# Patient Record
Sex: Male | Born: 1975 | Race: Black or African American | Hispanic: No | Marital: Married | Smoking: Never smoker
Health system: Southern US, Community
[De-identification: ages and names within clinical notes are randomized; demographics above are authoritative.]

## PROBLEM LIST (undated history)

## (undated) HISTORY — PX: ABDOMINAL SURGERY: SHX537

---

## 2017-06-15 ENCOUNTER — Emergency Department
Admission: EM | Admit: 2017-06-15 | Discharge: 2017-06-15 | Disposition: A | Discharge status: Home / Self Care | Payer: Self-pay | Attending: Emergency Medicine | Admitting: Emergency Medicine

## 2017-06-15 DIAGNOSIS — K0889 Other specified disorders of teeth and supporting structures: Secondary | ICD-10-CM | POA: Insufficient documentation

## 2017-06-15 DIAGNOSIS — K047 Periapical abscess without sinus: Secondary | ICD-10-CM | POA: Insufficient documentation

## 2017-06-15 MED ORDER — SULFAMETHOXAZOLE-TRIMETHOPRIM 800-160 MG PO TABS
ORAL_TABLET | ORAL | Status: AC
  Filled 2017-06-15: qty 1

## 2017-06-15 MED ORDER — SULFAMETHOXAZOLE-TRIMETHOPRIM 800-160 MG PO TABS
1.0000 | ORAL_TABLET | Freq: Once | ORAL | Status: AC
  Administered 2017-06-15: 1 via ORAL

## 2017-06-15 MED ORDER — KETOROLAC TROMETHAMINE 10 MG PO TABS: 10.0000 mg | ORAL_TABLET | Freq: Four times a day (QID) | ORAL | 0 refills | Status: AC | PRN

## 2017-06-15 MED ORDER — KETOROLAC TROMETHAMINE 30 MG/ML IJ SOLN
30.0000 mg | Freq: Once | INTRAMUSCULAR | Status: AC
  Administered 2017-06-15: 30 mg via INTRAMUSCULAR
  Filled 2017-06-15: qty 1

## 2017-06-15 MED ORDER — SULFAMETHOXAZOLE-TRIMETHOPRIM 800-160 MG PO TABS: 1.0000 | ORAL_TABLET | Freq: Two times a day (BID) | ORAL | 0 refills | Status: DC

## 2017-06-15 NOTE — ED Provider Notes (Signed)
[facility] Emergency Department Provider Note   ____________________________________________   I have reviewed the triage vital signs and the nursing notes.   HISTORY  Chief Complaint Dental Pain    HPI Micheal Perry is a 41 y.o. male presents to the emergency department with left upper jaw dental pain that began 2 days ago. Patient reports the upper molars, molar 1 and 2 have been decayed for quite some time however this is the first time he has experienced pain, swelling and likely infection. Patient denies any recent dental or facial trauma. Patient reports he does not see a dentist regularly. Patient denies fever, chills, headache, vision changes, chest pain, chest tightness, shortness of breath, abdominal pain, nausea and vomiting.  No past medical history on file.  There are no active problems to display for this patient.   No past surgical history on file.  Prior to Admission medications   Medication Sig Start Date End Date Taking? Authorizing Provider  ketorolac (TORADOL) 10 MG tablet Take 1 tablet (10 mg total) by mouth every 6 (six) hours as needed. 06/15/17 06/20/17  Merry Pond M, PA-C  sulfamethoxazole-trimethoprim (BACTRIM DS,SEPTRA DS) 800-160 MG tablet Take 1 tablet by mouth 2 (two) times daily. 06/15/17   Krosby Ritchie M, PA-C    Allergies Patient has no known allergies.  No family history on file.  Social History Social History  Substance Use Topics  . Smoking status: Not on file  . Smokeless tobacco: Not on file  . Alcohol use Not on file    Review of Systems Constitutional: Positive for low-grade fever. Eyes: No visual changes. ENT:  Negative for sore throat and for difficulty swallowing. Positive for left upper jaw and dental pain. Cardiovascular: Denies chest pain. Respiratory: Denies cough. Denies shortness of breath. Skin: Negative for rash. Neurological: Negative for headaches.   ____________________________________________   PHYSICAL EXAM:  VITAL SIGNS: ED Triage Vitals  Enc Vitals Group     BP 06/15/17 2047 131/85     Pulse Rate 06/15/17 2047 62     Resp 06/15/17 2047 18     Temp 06/15/17 2047 99.1 F (37.3 C)     Temp Source 06/15/17 2047 Oral     SpO2 06/15/17 2047 97 %     Weight 06/15/17 2047 206 lb (93.4 kg)     Height 06/15/17 2047 6\' 1"  (1.854 m)     Head Circumference --      Peak Flow --      Pain Score 06/15/17 2046 9     Pain Loc --      Pain Edu? --      Excl. in GC? --     Constitutional: Alert and oriented. Well appearing and in no acute distress.  Head: Normocephalic and atraumatic. Eyes: Conjunctivae are normal. PERRL. Normal extraocular movements.  Nose: No congestion/rhinorrhea Mouth/Throat: Mucous membranes are moist. Oropharynx erythematous or edematous.. Tonsils symmetrical bilaterally noninflamed. Uvula midline. Left upper gum line erythematous with mild edema. Molar 1 and molar 2 significant dental caries. Buccal mucosa erythematous without edema.  Neck: Supple. Hematological/Lymphatic/Immunological: No lymphadenopathy. Cardiovascular: Normal rate, regular rhythm. Normal distal pulses. Respiratory: Normal respiratory effort. No wheezes/rales/rhonchi. Lungs CTAB Musculoskeletal: Nontender with normal range of motion in all extremities. Neurologic: Normal speech and language.  Skin:  Skin is warm, dry and intact. No rash noted. Psychiatric: Mood and affect are normal.  ____________________________________________   LABS (all labs ordered are listed, but only abnormal results are displayed)  Labs  Reviewed - No data to display ____________________________________________  EKG None ____________________________________________  RADIOLOGY None ____________________________________________   PROCEDURES  Procedure(s) performed: No    Critical Care performed:  no ____________________________________________   INITIAL IMPRESSION / ASSESSMENT AND PLAN / ED COURSE  Pertinent labs & imaging results that were available during my care of the patient were reviewed by me and considered in my medical decision making (see chart for details).  Patient presents to emergency department with left upper jaw dental pain.Marland Kitchen. History and physical exam findings are consistent with left upper jaw dental infection. Patient  given Toradol 30 mg IM for pain management an initial dose of Bactrim for antibiotic coverage during the course of care in the emergency department. Patient will be prescribed Bactrim for coverage and a 5 day course of Toradol for pain and inflammation.. Patient advised to follow up with a dental clinic for continued care or return to the emergency department if symptoms return or worsen. Patient informed of clinical course, understand medical decision-making process, and agree with plan. ____________________________________________   FINAL CLINICAL IMPRESSION(S) / ED DIAGNOSES  Final diagnoses:  Pain, dental  Dental infection       NEW MEDICATIONS STARTED DURING THIS VISIT:  New Prescriptions   KETOROLAC (TORADOL) 10 MG TABLET    Take 1 tablet (10 mg total) by mouth every 6 (six) hours as needed.   SULFAMETHOXAZOLE-TRIMETHOPRIM (BACTRIM DS,SEPTRA DS) 800-160 MG TABLET    Take 1 tablet by mouth 2 (two) times daily.     Note:  This document was prepared using Dragon voice recognition software and may include unintentional dictation errors.    Kadeen Sroka, Karl Pockraci M, PA-C 06/15/17 2228    Sharman CheekStafford, Phillip, MD 06/15/17 [phone]

## 2017-06-15 NOTE — ED Triage Notes (Signed)
Patient reports left upper jaw dental pain for 2 days.

## 2017-06-15 NOTE — Discharge Instructions (Signed)
OPTIONS FOR DENTAL FOLLOW UP CARE ° °[place] Department of Health and Human Services - Local Safety Net Dental Clinics °http://www.ncdhhs.gov/dph/oralhealth/services/safetynetclinics.htm °  °[facility] (336-562-3123) ° °[facility] [place] (919-933-9087) ° °[facility] (919-663-1744 ext [phone]) ° ° [facility] ([phone]) ° °[facility] ([phone]) °This clinic caters to the indigent population and is on a lottery system. °Location: °[facility], [address], [place] °Clinic Hours: °Wednesdays from 6pm - 9pm, patients seen by a lottery system. °For dates, call or go to www.med.[facility].edu/[facility]/patients/Dental-[facility] °Services: °Cleanings, fillings and simple extractions. °Payment Options: °DENTAL WORK IS FREE OF CHARGE. Bring proof of income or support. °Best way to get seen: °Arrive at 5:15 pm - this is a lottery, NOT first come/first serve, so arriving earlier will not increase your chances of being seen. °  °  °[facility] °[phone] °Select option 1 for emergencies °  °Location: °[facility], [address], [place] °Clinic Hours: °No walk-ins accepted - call the day before to schedule an appointment. °Check in times are 9:30 am and 1:30 pm. °Services: °Simple extractions, temporary fillings, pulpectomy/pulp debridement, uncomplicated abscess drainage. °Payment Options: °PAYMENT IS DUE AT THE TIME OF SERVICE.  Fee is usually $100-200, additional surgical procedures (e.g. abscess drainage) may be extra. °Cash, checks, Visa/MasterCard accepted.  Can file Medicaid if patient is covered for dental - patient should call case worker to check. °No discount for [facility] patients. °Best way to get seen: °MUST call the day before and get onto the schedule. Can usually be seen the next 1-2 days. No walk-ins accepted. °  °  °[facility] °[phone] °   °Location: °[facility], [address], [place] °Clinic Hours: °M, W, Th, F 8am or 1:30pm, Tues 9a or 1:30 - first come/first served. °Services: °Simple extractions, temporary fillings, uncomplicated abscess drainage.  You do not need to be an [place] resident. °Payment Options: °PAYMENT IS DUE AT THE TIME OF SERVICE. °Dental insurance, otherwise sliding scale - bring proof of income or support. °Depending on income and treatment needed, cost is usually $50-200. °Best way to get seen: °Arrive early as it is first come/first served. °  °  °[facility] °[phone] °  °Location: °[address] °Clinic Hours: °Mon-Thu 8a-5p °Services: °Most basic dental services including extractions and fillings. °Payment Options: °PAYMENT IS DUE AT THE TIME OF SERVICE. °Sliding scale, up to 50% off - bring proof if income or support. °Medicaid with dental option accepted. °Best way to get seen: °Call to schedule an appointment, can usually be seen within 2 weeks OR they will try to see walk-ins - show up at 8a or 2p (you may have to wait). °  °  °[facility] °919-245-2435 °[place] RESIDENTS ONLY °  °Location: °[facility], [address], [place] [address] °Clinic Hours: By appointment only. °Monday - Thursday 8am-5pm, Friday 8am-12pm °Services: Cleanings, fillings, extractions. °Payment Options: °PAYMENT IS DUE AT THE TIME OF SERVICE. °Cash, Visa or MasterCard. Sliding scale - $30 minimum per service. °Best way to get seen: °Come in to office, complete packet and make an appointment - need proof of income °or support monies for each household member and proof of [place] residence. °Usually takes about a month to get in. °  °  °[facility] °[phone] °  °Location: °[address]   [place] °Clinic Hours: Walk-in Urgent Care Dental Services are offered Monday-Friday  mornings only. °The numbers of emergencies accepted daily is limited to the number of °providers available. °Maximum 15 - Mondays, Wednesdays & Thursdays °Maximum 10 - Tuesdays & Fridays °Services: °You do not need to be a [place] resident to be seen for a dental emergency. °Emergencies are defined as pain, swelling, abnormal bleeding, or dental trauma. Walkins will receive x-rays if needed. °NOTE: Dental cleaning is not an emergency. °Payment Options: °PAYMENT IS DUE AT THE TIME OF SERVICE. °Minimum co-pay is $40.00 for uninsured patients. °Minimum co-pay is $3.00 for Medicaid with dental coverage. °Dental Insurance is accepted and must be presented at time of visit. °Medicare does not cover dental. °Forms of payment: Cash, credit card, checks. °Best way to get seen: °If not previously registered with the clinic, walk-in dental registration begins at 7:15 am and is on a first come/first serve basis. °If previously registered with the clinic, call to make an appointment. °  °  °[facility] °919-776-4359 °[place] RESIDENTS ONLY °  °Location: °[address], [place], [place] °Clinic Hours: °Mon-Thu 10a-2p °Services: Extractions only! °Payment Options: °FREE (donations accepted) - bring proof of income or support °Best way to get seen: °Call and schedule an appointment OR come at 8am on the 1st Monday of every month (except for holidays) when it is first come/first served. °  °  °[facility] °[phone] °  °Location: °[address], [place] °Clinic Hours: °Friday mornings °Services, Payment Options, Best way to get seen: °Call for info °

## 2018-01-26 ENCOUNTER — Emergency Department: Payer: Medicaid Other

## 2018-01-26 ENCOUNTER — Other Ambulatory Visit: Payer: Self-pay

## 2018-01-26 ENCOUNTER — Emergency Department
Admission: EM | Admit: 2018-01-26 | Discharge: 2018-01-26 | Disposition: A | Discharge status: Home / Self Care | Payer: Medicaid Other | Attending: Emergency Medicine | Admitting: Emergency Medicine

## 2018-01-26 ENCOUNTER — Encounter: Payer: Self-pay | Admitting: Emergency Medicine

## 2018-01-26 DIAGNOSIS — J189 Pneumonia, unspecified organism: Secondary | ICD-10-CM | POA: Diagnosis not present

## 2018-01-26 DIAGNOSIS — Z79899 Other long term (current) drug therapy: Secondary | ICD-10-CM | POA: Insufficient documentation

## 2018-01-26 DIAGNOSIS — J181 Lobar pneumonia, unspecified organism: Secondary | ICD-10-CM

## 2018-01-26 DIAGNOSIS — R05 Cough: Secondary | ICD-10-CM | POA: Diagnosis present

## 2018-01-26 MED ORDER — DOXYCYCLINE HYCLATE 100 MG PO TABS
100.0000 mg | ORAL_TABLET | Freq: Once | ORAL | Status: AC
  Administered 2018-01-26: 100 mg via ORAL
  Filled 2018-01-26: qty 1

## 2018-01-26 MED ORDER — IBUPROFEN 800 MG PO TABS
800.0000 mg | ORAL_TABLET | Freq: Once | ORAL | Status: AC
  Administered 2018-01-26: 800 mg via ORAL
  Filled 2018-01-26: qty 1

## 2018-01-26 MED ORDER — DOXYCYCLINE HYCLATE 100 MG PO TABS: 100.0000 mg | ORAL_TABLET | Freq: Two times a day (BID) | ORAL | 0 refills | Status: DC

## 2018-01-26 MED ORDER — PREDNISONE 20 MG PO TABS: 40.0000 mg | ORAL_TABLET | Freq: Every day | ORAL | 0 refills | Status: DC

## 2018-01-26 MED ORDER — DOXYCYCLINE HYCLATE 100 MG PO TABS: 100.0000 mg | ORAL_TABLET | Freq: Two times a day (BID) | ORAL | 0 refills | Status: AC

## 2018-01-26 MED ORDER — PSEUDOEPH-BROMPHEN-DM 30-2-10 MG/5ML PO SYRP: 10.0000 mL | ORAL_SOLUTION | Freq: Four times a day (QID) | ORAL | 0 refills | Status: DC | PRN

## 2018-01-26 NOTE — ED Triage Notes (Addendum)
Pt c/o cough and chills and chest congestion for the past week.  Pt reports yellow sputum when coughing. Using OTC medications with minimal relief.  Pt states he has missed days of work because of illness.

## 2018-01-26 NOTE — Discharge Instructions (Signed)
Please follow-up with your primary care doctor or [facility] in 1 week for recheck. It is recommended that he have repeat x-ray in 3-4 weeks to make sure pneumonia has resolved.  Return to the ER immediately for any shortness of breath fevers, fevers, worsening symptoms or urgent changes in health.

## 2018-01-26 NOTE — ED Provider Notes (Signed)
[facility] EMERGENCY DEPARTMENT Provider Note   CSN: [id number] Arrival date & time: 01/26/18  1649     History   Chief Complaint Chief Complaint  Patient presents with  . Cough  . Chills    HPI Micheal Perry is a 42 y.o. male presents to the emergency department for evaluation of 1 week history of cough, sinus pain, congestion, body aches and chills.  Symptoms been present for 1 week he felt like he was getting better but now getting worse.  He has had subjective fevers.  No nausea vomiting abdominal pain or chest pain.  Denies any shortness of breath or wheezing.  Denies any rashes.  He has been taking over-the-counter medications such as Mucinex with only mild improvement.  He has not had Tylenol or ibuprofen.  HPI  History reviewed. No pertinent past medical history.  There are no active problems to display for this patient.   Past Surgical History:  Procedure Laterality Date  . ABDOMINAL SURGERY         Home Medications    Prior to Admission medications   Medication Sig Start Date End Date Taking? Authorizing Provider  sulfamethoxazole-trimethoprim (BACTRIM DS,SEPTRA DS) 800-160 MG tablet Take 1 tablet by mouth 2 (two) times daily. 06/15/17   Little, Jordan Likes, PA-C    Family History History reviewed. No pertinent family history.  Social History Social History   Tobacco Use  . Smoking status: Never Smoker  . Smokeless tobacco: Never Used  Substance Use Topics  . Alcohol use: No    Frequency: Never  . Drug use: Yes    Types: Marijuana    Comment: occasional use of MJ     Allergies   Patient has no known allergies.   Review of Systems Review of Systems  Constitutional: Negative for fever.  HENT: Positive for congestion, rhinorrhea, sinus pressure, sinus pain and sore throat.   Respiratory: Positive for cough. Negative for wheezing and stridor.   Gastrointestinal: Negative for diarrhea, nausea and vomiting.  Musculoskeletal:  Positive for myalgias. Negative for arthralgias and neck stiffness.  Skin: Negative for rash.  Neurological: Negative for dizziness.     Physical Exam Updated Vital Signs BP 137/82 (BP Location: Left Arm)   Pulse 87   Temp 98.9 F (37.2 C) (Oral)   Resp 18   Ht 6' 1.25" (1.861 m)   Wt 90.7 kg (200 lb)   SpO2 99%   BMI 26.21 kg/m   Physical Exam  Constitutional: He is oriented to person, place, and time. He appears well-developed and well-nourished. No distress.  HENT:  Head: Normocephalic and atraumatic.  Right Ear: Hearing, tympanic membrane, external ear and ear canal normal.  Left Ear: Hearing, tympanic membrane, external ear and ear canal normal.  Nose: Rhinorrhea present. No nasal septal hematoma. Right sinus exhibits no maxillary sinus tenderness and no frontal sinus tenderness. Left sinus exhibits no maxillary sinus tenderness and no frontal sinus tenderness.  Mouth/Throat: No trismus in the jaw. No uvula swelling. No oropharyngeal exudate, posterior oropharyngeal edema, posterior oropharyngeal erythema or tonsillar abscesses.  Positive maxillary sinus tenderness.  Eyes: Conjunctivae are normal.  Neck: Normal range of motion.  Cardiovascular: Normal rate and regular rhythm.  Pulmonary/Chest: Effort normal. No stridor. No respiratory distress. He has no wheezes. He exhibits no tenderness.  No signs of respiratory distress.  Good air movement.  Abdominal: Soft. He exhibits no distension. There is no tenderness. There is no guarding.  Musculoskeletal: Normal range of motion.  Neurological: He is alert and oriented to person, place, and time.  Skin: Skin is warm and dry. No rash noted.  Psychiatric: He has a normal mood and affect. His behavior is normal. Judgment and thought content normal.     ED Treatments / Results  Labs (all labs ordered are listed, but only abnormal results are displayed) Labs Reviewed - No data to display  EKG  EKG Interpretation None        Radiology Dg Chest 2 View  Result Date: 01/26/2018 CLINICAL DATA:  Productive cough. EXAM: CHEST  2 VIEW COMPARISON:  None. FINDINGS: The heart size and mediastinal contours are within normal limits. No pneumothorax or pleural effusion is noted. Right middle lobe opacity is noted concerning for pneumonia. The visualized skeletal structures are unremarkable. IMPRESSION: Right middle lobe pneumonia. Followup PA and lateral chest X-ray is recommended in 3-4 weeks following trial of antibiotic therapy to ensure resolution and exclude underlying malignancy. Electronically Signed   By: Lupita RaiderJames  Green Jr, M.D.   On: 01/26/2018 18:22    Procedures Procedures (including critical care time)  Medications Ordered in ED Medications  ibuprofen (ADVIL,MOTRIN) tablet 800 mg (800 mg Oral Given 01/26/18 1810)     Initial Impression / Assessment and Plan / ED Course  I have reviewed the triage vital signs and the nursing notes.  Pertinent labs & imaging results that were available during my care of the patient were reviewed by me and considered in my medical decision making (see chart for details).     42 year old male with right middle lobe pneumonia.  Vital signs within normal limits.  He is not tachypneic or in any signs of respiratory distress.  Curb 65 score indicates low risk for mortality, will treat educated on as outpatient.  Patient educated on importance of antibiotics.  He is educated on increasing his fluids alternating Tylenol and ibuprofen as needed.  He is educated on signs and symptoms return to ED for.  Is also educated on importance of repeat x-ray in 3-4 weeks  Final Clinical Impressions(s) / ED Diagnoses   Final diagnoses:  Bronchitis  Acute non-recurrent maxillary sinusitis    ED Discharge Orders    None       Ronnette JuniperGaines, Thomas C, PA-C 01/26/18 1842    Governor RooksLord, Rebecca, MD 01/31/18 [phone]

## 2018-02-13 ENCOUNTER — Ambulatory Visit: Payer: Medicaid Other

## 2018-02-13 ENCOUNTER — Other Ambulatory Visit: Payer: Self-pay

## 2018-02-13 ENCOUNTER — Ambulatory Visit
Admission: EM | Admit: 2018-02-13 | Discharge: 2018-02-13 | Disposition: A | Discharge status: Home / Self Care | Payer: Medicaid Other | Attending: Family Medicine | Admitting: Family Medicine

## 2018-02-13 DIAGNOSIS — R197 Diarrhea, unspecified: Secondary | ICD-10-CM | POA: Insufficient documentation

## 2018-02-13 DIAGNOSIS — R112 Nausea with vomiting, unspecified: Secondary | ICD-10-CM | POA: Diagnosis not present

## 2018-02-13 DIAGNOSIS — R0781 Pleurodynia: Secondary | ICD-10-CM | POA: Diagnosis present

## 2018-02-13 LAB — COMPREHENSIVE METABOLIC PANEL
ALBUMIN: 4.4 g/dL (ref 3.5–5.0)
ALT: 37 U/L (ref 17–63)
AST: 53 U/L — AB (ref 15–41)
Alkaline Phosphatase: 43 U/L (ref 38–126)
Anion gap: 9 (ref 5–15)
BILIRUBIN TOTAL: 0.7 mg/dL (ref 0.3–1.2)
BUN: 17 mg/dL (ref 6–20)
CHLORIDE: 102 mmol/L (ref 101–111)
CO2: 24 mmol/L (ref 22–32)
CREATININE: 1.12 mg/dL (ref 0.61–1.24)
Calcium: 9.9 mg/dL (ref 8.9–10.3)
GFR calc Af Amer: >60 mL/min (ref >60)
GLUCOSE: 113 mg/dL — AB (ref 65–99)
Potassium: 3.8 mmol/L (ref 3.5–5.1)
Sodium: 135 mmol/L (ref 135–145)
Total Protein: 8.2 g/dL — ABNORMAL HIGH (ref 6.5–8.1)

## 2018-02-13 LAB — CBC WITH DIFFERENTIAL/PLATELET
BASOS PCT: 1 %
Basophils Absolute: 0.1 10*3/uL (ref 0–0.1)
Eosinophils Absolute: 0.2 10*3/uL (ref 0–0.7)
Eosinophils Relative: 3 %
HEMATOCRIT: 42.4 % (ref 40.0–52.0)
Hemoglobin: 14.5 g/dL (ref 13.0–18.0)
LYMPHS PCT: 44 %
Lymphs Abs: 2.6 10*3/uL (ref 1.0–3.6)
MCH: 28.7 pg (ref 26.0–34.0)
MCHC: 34.2 g/dL (ref 32.0–36.0)
MCV: 83.9 fL (ref 80.0–100.0)
MONO ABS: 0.4 10*3/uL (ref 0.2–1.0)
Monocytes Relative: 7 %
NEUTROS ABS: 2.7 10*3/uL (ref 1.4–6.5)
NEUTROS PCT: 45 %
Platelets: 245 10*3/uL (ref 150–440)
RBC: 5.06 MIL/uL (ref 4.40–5.90)
RDW: 13.6 % (ref 11.5–14.5)
WBC: 6 10*3/uL (ref 3.8–10.6)

## 2018-02-13 MED ORDER — ONDANSETRON 4 MG PO TBDP: 4.0000 mg | ORAL_TABLET | Freq: Three times a day (TID) | ORAL | 0 refills | Status: DC | PRN

## 2018-02-13 NOTE — Discharge Instructions (Signed)
Rest, fluids. ° °Zofran as needed. ° °Take care ° °Dr. Notnamed Scholz  °

## 2018-02-13 NOTE — ED Provider Notes (Signed)
[facility]   CSN: [id number] Arrival date & time: 02/13/18  1918  History   Chief Complaint Chief Complaint  Patient presents with  . Diarrhea   HPI  42 year old male presents with nausea, vomiting, diarrhea.  He is also had ongoing weight loss.  Patient recently diagnosed and treated for community-acquired pneumonia on 3/3.  He states that he has had some ongoing pleuritic pain.  He is concerned given continued pain.  Additionally, today he developed nausea, vomiting, diarrhea.  He has not thrown up since this morning.  He has been able to take food and drink.  No known exacerbating factors.  No reported sick contacts with similar illness.  No other associated symptoms.  No other complaints.  PMH - recent CAP.  Past Surgical History:  Procedure Laterality Date  . ABDOMINAL SURGERY      Home Medications    Prior to Admission medications   Medication Sig Start Date End Date Taking? Authorizing Provider  ondansetron (ZOFRAN-ODT) 4 MG disintegrating tablet Take 1 tablet (4 mg total) by mouth every 8 (eight) hours as needed for nausea or vomiting. 02/13/18   Tommie Samsook, Shuna Tabor G, DO   Family History No reported family hx.  Social History Social History   Tobacco Use  . Smoking status: Never Smoker  . Smokeless tobacco: Never Used  Substance Use Topics  . Alcohol use: No    Frequency: Never  . Drug use: Yes    Types: Marijuana    Comment: occasional use of MJ     Allergies   Patient has no known allergies.   Review of Systems Review of Systems  Constitutional: Negative for fever.  Respiratory:       Pleuritic pain.  Gastrointestinal: Positive for diarrhea, nausea and vomiting.   Physical Exam Triage Vital Signs ED Triage Vitals  Enc Vitals Group     BP 02/13/18 1946 132/83     Pulse Rate 02/13/18 1946 92     Resp 02/13/18 1946 18     Temp 02/13/18 1946 98.3 F (36.8 C)     Temp Source 02/13/18 1946 Oral     SpO2 02/13/18 1946 100 %     Weight  02/13/18 1947 188 lb (85.3 kg)     Height 02/13/18 1947 6\' 1"  (1.854 m)     Head Circumference --      Peak Flow --      Pain Score 02/13/18 2052 3     Pain Loc --      Pain Edu? --      Excl. in GC? --    Updated Vital Signs BP 132/83 (BP Location: Left Arm)   Pulse 92   Temp 98.3 F (36.8 C) (Oral)   Resp 18   Ht 6\' 1"  (1.854 m)   Wt 188 lb (85.3 kg)   SpO2 100%   BMI 24.80 kg/m     Physical Exam  Constitutional: He is oriented to person, place, and time. He appears well-developed. No distress.  HENT:  Head: Normocephalic and atraumatic.  Cardiovascular: Normal rate and regular rhythm.  No murmur heard. Pulmonary/Chest: Effort normal and breath sounds normal. He has no wheezes. He has no rales.  Abdominal: Soft. He exhibits no distension. There is no tenderness.  Neurological: He is alert and oriented to person, place, and time.  Psychiatric: He has a normal mood and affect. His behavior is normal.  Nursing note and vitals reviewed.  UC Treatments / Results  Labs (all labs  ordered are listed, but only abnormal results are displayed) Labs Reviewed  COMPREHENSIVE METABOLIC PANEL - Abnormal; Notable for the following components:      Result Value   Glucose, Bld 113 (*)    Total Protein 8.2 (*)    AST 53 (*)    All other components within normal limits  CBC WITH DIFFERENTIAL/PLATELET    EKG  EKG Interpretation None       Radiology Dg Chest 2 View  Result Date: 02/13/2018 CLINICAL DATA:  Recent pneumonia. EXAM: CHEST - 2 VIEW COMPARISON:  Chest radiograph 01/26/2018 FINDINGS: Resolution of right middle lobe pneumonia. No residual opacity. The lungs are clear. Normal cardiomediastinal contours. IMPRESSION: Resolution of right middle lobe pneumonia. Electronically Signed   By: Deatra Robinson M.D.   On: 02/13/2018 20:39    Procedures Procedures (including critical care time)  Medications Ordered in UC Medications - No data to display   Initial Impression /  Assessment and Plan / UC Course  I have reviewed the triage vital signs and the nursing notes.  Pertinent labs & imaging results that were available during my care of the patient were reviewed by me and considered in my medical decision making (see chart for details).     41 year old male presents with pleuritic pain as well as nausea, vomiting, diarrhea.  Laboratory studies unrevealing.  Chest x-ray with resolution of pneumonia.  Advised supportive care and Zofran as needed.  Final Clinical Impressions(s) / UC Diagnoses   Final diagnoses:  Nausea vomiting and diarrhea    ED Discharge Orders        Ordered    ondansetron (ZOFRAN-ODT) 4 MG disintegrating tablet  Every 8 hours PRN     02/13/18 2046     Controlled Substance Prescriptions [place] Controlled Substance Registry consulted? Not Applicable   Tommie Sams, DO 02/13/18 2110

## 2018-02-13 NOTE — ED Triage Notes (Signed)
Patient complains of vomiting and diarrhea that started today.

## 2018-10-27 ENCOUNTER — Encounter: Payer: Self-pay | Admitting: Emergency Medicine

## 2018-10-27 ENCOUNTER — Ambulatory Visit
Admission: EM | Admit: 2018-10-27 | Discharge: 2018-10-27 | Disposition: A | Discharge status: Home / Self Care | Payer: Medicaid Other | Attending: Family Medicine | Admitting: Family Medicine

## 2018-10-27 ENCOUNTER — Other Ambulatory Visit: Payer: Self-pay

## 2018-10-27 DIAGNOSIS — R369 Urethral discharge, unspecified: Secondary | ICD-10-CM

## 2018-10-27 DIAGNOSIS — R300 Dysuria: Secondary | ICD-10-CM

## 2018-10-27 LAB — URINALYSIS, COMPLETE (UACMP) WITH MICROSCOPIC
Bacteria, UA: NONE SEEN
GLUCOSE, UA: NEGATIVE mg/dL
HGB URINE DIPSTICK: NEGATIVE
Ketones, ur: NEGATIVE mg/dL
Nitrite: NEGATIVE
RBC / HPF: NONE SEEN RBC/hpf (ref 0–5)
SPECIFIC GRAVITY, URINE: 1.02 (ref 1.005–1.030)
SQUAMOUS EPITHELIAL / LPF: NONE SEEN (ref 0–5)
pH: 7 (ref 5.0–8.0)

## 2018-10-27 LAB — CHLAMYDIA/NGC RT PCR (ARMC ONLY)
CHLAMYDIA TR: NOT DETECTED
N GONORRHOEAE: NOT DETECTED

## 2018-10-27 MED ORDER — AZITHROMYCIN 500 MG PO TABS
1000.0000 mg | ORAL_TABLET | Freq: Once | ORAL | Status: AC
  Administered 2018-10-27: 1000 mg via ORAL

## 2018-10-27 NOTE — ED Triage Notes (Signed)
Pt c/o dysuria and penile discharge. Started about a week ago. No new sex partners.

## 2018-10-27 NOTE — ED Provider Notes (Signed)
[facility]    CSN: [id number] Arrival date & time: 10/27/18  1333     History   Chief Complaint Chief Complaint  Patient presents with  . Dysuria  . Penile Discharge    HPI Micheal Perry is a 42 y.o. male.   42 yo male with a c/o burning with urination and penile discharge for about 1 week. Denies any fevers, chills. Denies any new sex partners. States in monogamous relationship with wife.  The history is provided by the patient.    History reviewed. No pertinent past medical history.  There are no active problems to display for this patient.   Past Surgical History:  Procedure Laterality Date  . ABDOMINAL SURGERY         Home Medications    Prior to Admission medications   Medication Sig Start Date End Date Taking? Authorizing Provider  ondansetron (ZOFRAN-ODT) 4 MG disintegrating tablet Take 1 tablet (4 mg total) by mouth every 8 (eight) hours as needed for nausea or vomiting. 02/13/18   Tommie Sams, DO    Family History Family History  Problem Relation Age of Onset  . Healthy Mother   . Healthy Father     Social History Social History   Tobacco Use  . Smoking status: Never Smoker  . Smokeless tobacco: Never Used  Substance Use Topics  . Alcohol use: No    Frequency: Never  . Drug use: Yes    Types: Marijuana    Comment: occasional use of MJ     Allergies   Patient has no known allergies.   Review of Systems Review of Systems   Physical Exam Triage Vital Signs ED Triage Vitals  Enc Vitals Group     BP 10/27/18 1358 122/80     Pulse Rate 10/27/18 1358 76     Resp 10/27/18 1358 18     Temp 10/27/18 1358 99.2 F (37.3 C)     Temp Source 10/27/18 1358 Oral     SpO2 10/27/18 1358 100 %     Weight 10/27/18 1354 185 lb (83.9 kg)     Height 10/27/18 1354 6' 1.25" (1.861 m)     Head Circumference --      Peak Flow --      Pain Score 10/27/18 1354 0     Pain Loc --      Pain Edu? --      Excl. in GC? --    No data  found.  Updated Vital Signs BP 122/80 (BP Location: Left Arm)   Pulse 76   Temp 99.2 F (37.3 C) (Oral)   Resp 18   Ht 6' 1.25" (1.861 m)   Wt 83.9 kg   SpO2 100%   BMI 24.24 kg/m   Visual Acuity Right Eye Distance:   Left Eye Distance:   Bilateral Distance:    Right Eye Near:   Left Eye Near:    Bilateral Near:     Physical Exam  Constitutional: He appears well-developed and well-nourished. No distress.  Genitourinary: Testes normal. Discharge (clear; mild) found.  Skin: He is not diaphoretic.  Nursing note and vitals reviewed.    UC Treatments / Results  Labs (all labs ordered are listed, but only abnormal results are displayed) Labs Reviewed  URINALYSIS, COMPLETE (UACMP) WITH MICROSCOPIC - Abnormal; Notable for the following components:      Result Value   Bilirubin Urine SMALL (*)    Protein, ur TRACE (*)  Leukocytes, UA TRACE (*)    All other components within normal limits  CHLAMYDIA/NGC RT PCR [facility] ONLY)    EKG None  Radiology No results found.  Procedures Procedures (including critical care time)  Medications Ordered in UC Medications  azithromycin (ZITHROMAX) tablet 1,000 mg (1,000 mg Oral Given 10/27/18 1436)    Initial Impression / Assessment and Plan / UC Course  I have reviewed the triage vital signs and the nursing notes.  Pertinent labs & imaging results that were available during my care of the patient were reviewed by me and considered in my medical decision making (see chart for details).      Final Clinical Impressions(s) / UC Diagnoses   Final diagnoses:  Penile discharge  Dysuria   Discharge Instructions   None    ED Prescriptions    None     1. Lab results and diagnosis reviewed with patient 2. Check gc/chlamydia  3. Given zithromax 1gm po x1 4. Follow-up prn if symptoms worsen or don't improve   Controlled Substance Prescriptions [place] Controlled Substance Registry consulted? Not Applicable     Payton Mccallumonty, Uzziel Russey, MD 10/27/18 [phone] [id number]

## 2018-10-28 ENCOUNTER — Telehealth: Payer: Self-pay | Admitting: Emergency Medicine

## 2018-10-28 NOTE — Telephone Encounter (Signed)
Pt calls in asking for test results. I explained GC/Chlamydia were negative. He verbalized understanding.

## 2018-10-29 IMAGING — CR DG CHEST 2V
2 series · 2 of 2 positions shown · non-contrast
Comparison: Chest radiograph 01/26/2018

CLINICAL DATA: Recent pneumonia.

EXAM:
CHEST - 2 VIEW

[chest pa]
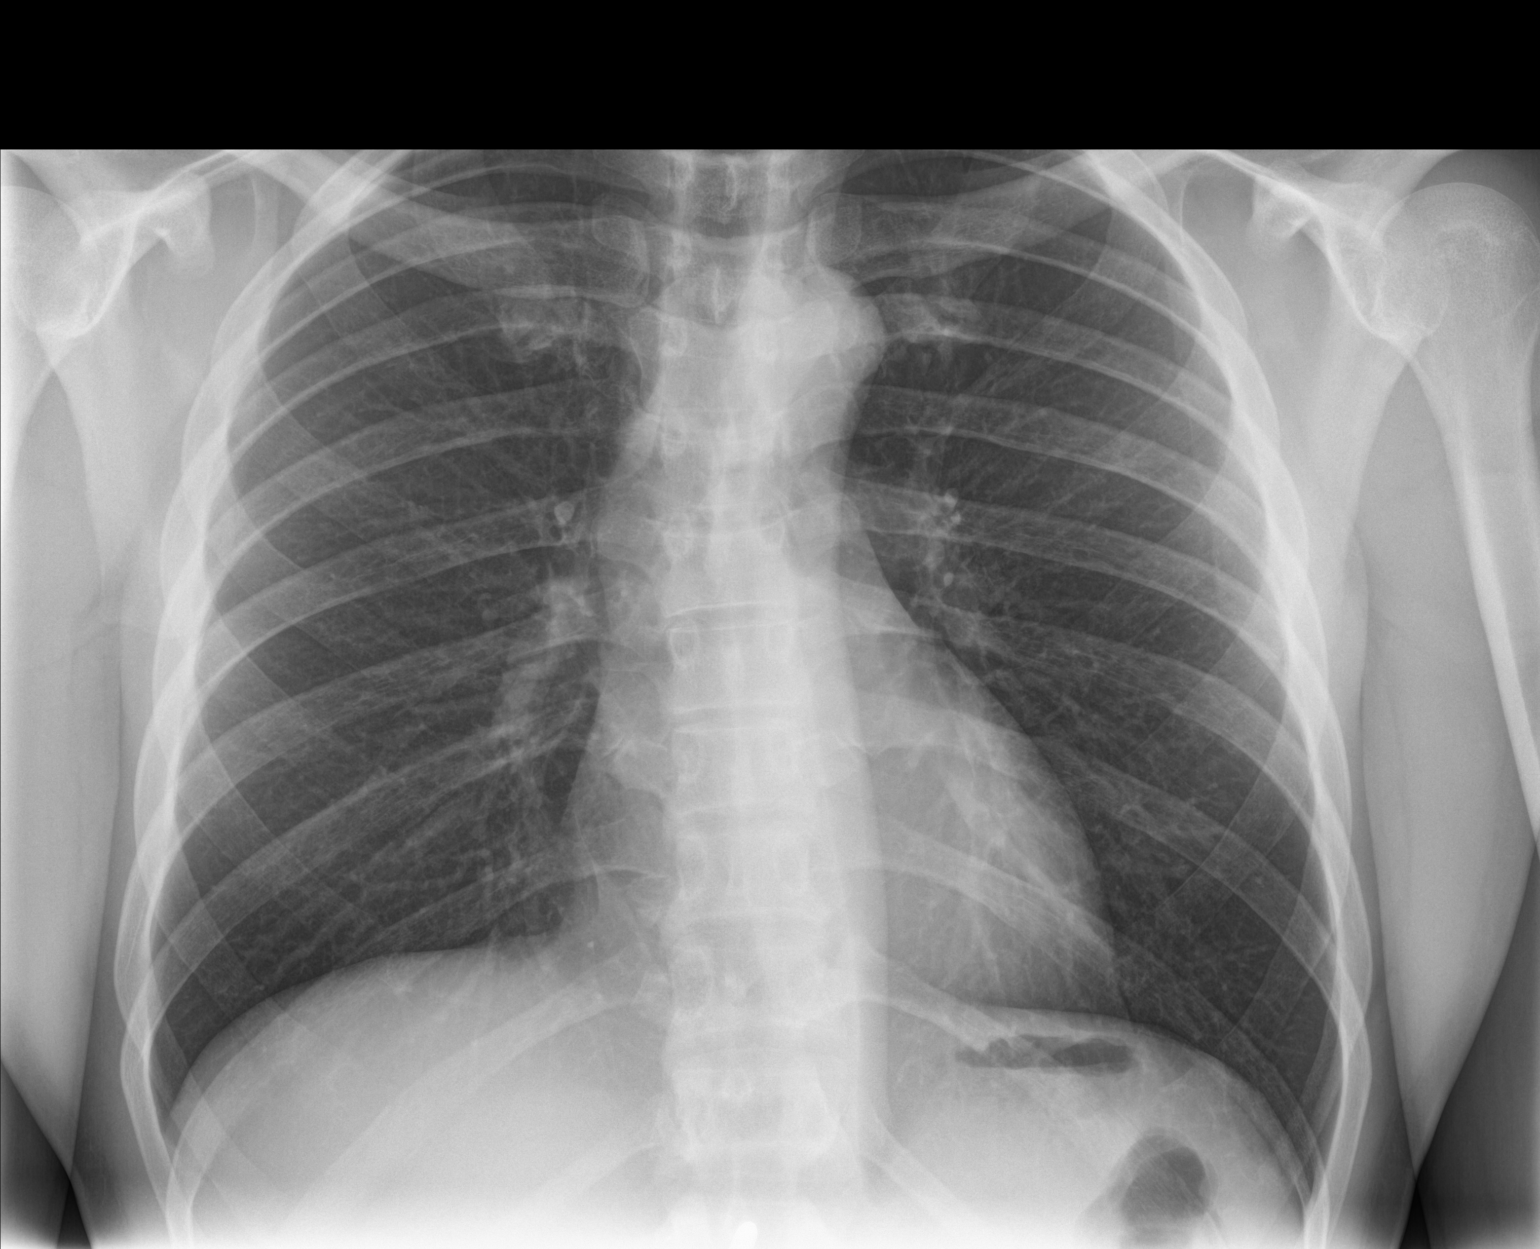

[chest lat]
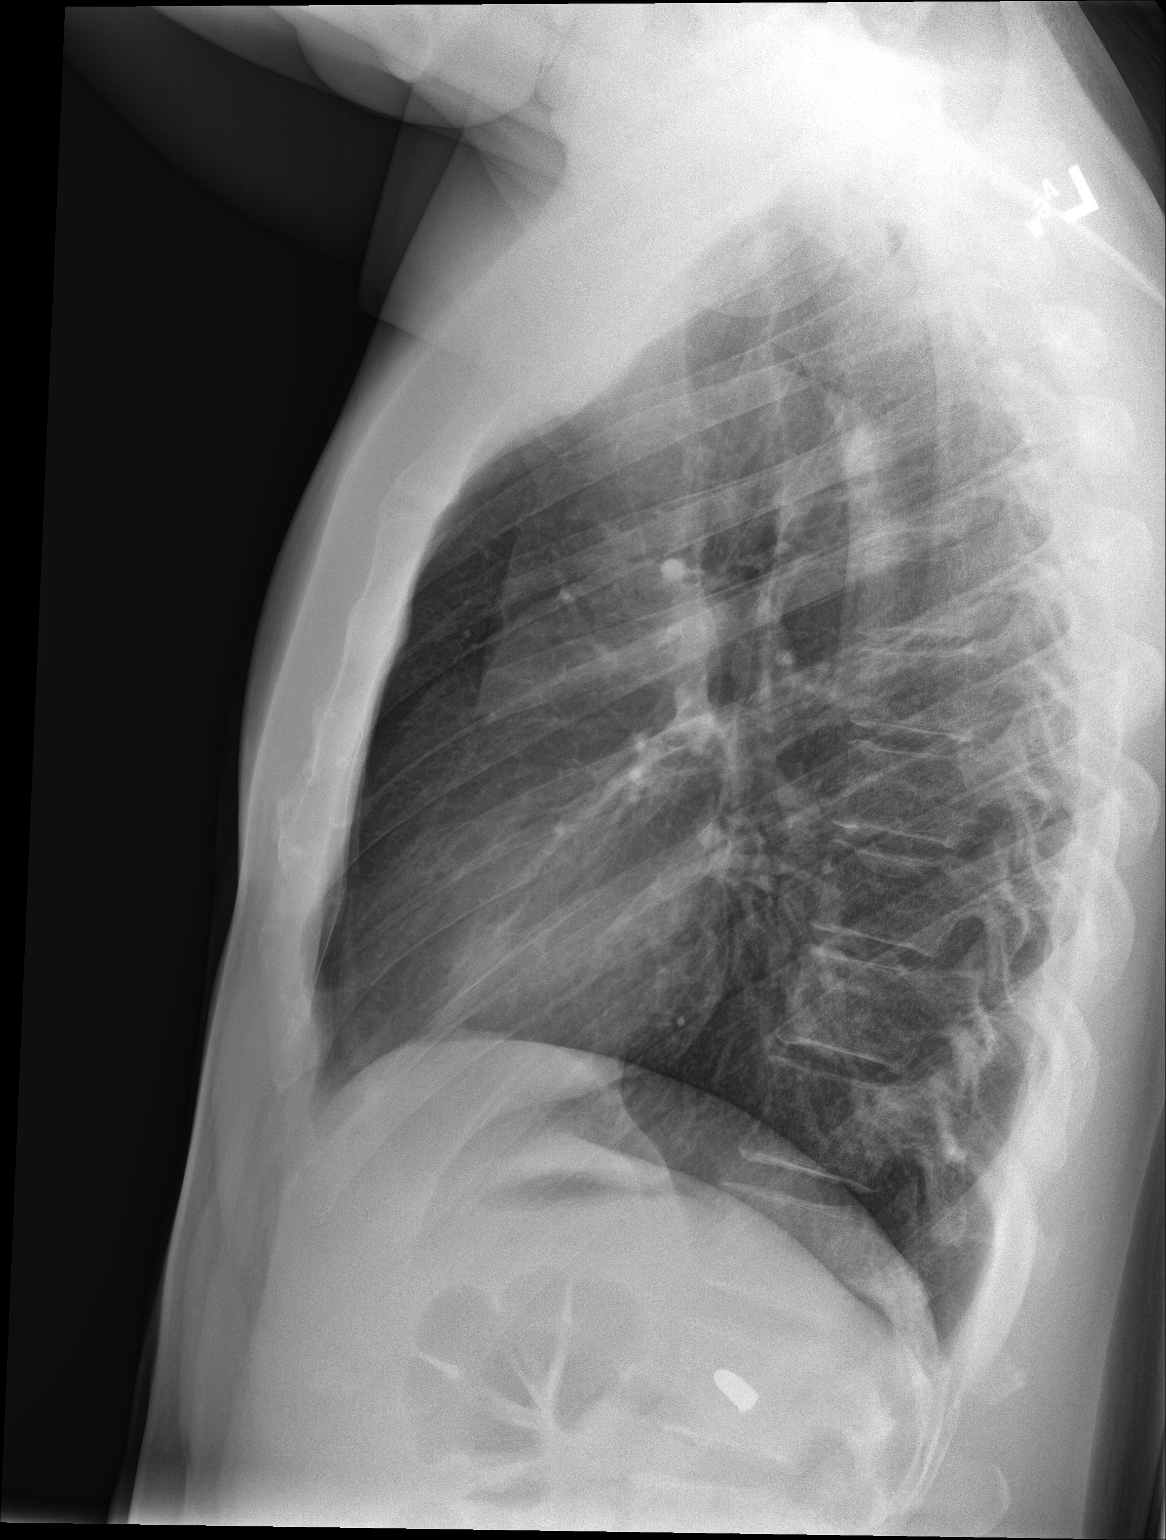

[2 of 2 positions shown; findings below may reference images not displayed]

FINDINGS: Resolution of right middle lobe pneumonia. No residual opacity. The
lungs are clear. Normal cardiomediastinal contours.
IMPRESSION: Resolution of right middle lobe pneumonia.

## 2019-05-07 ENCOUNTER — Other Ambulatory Visit: Payer: Self-pay

## 2019-05-07 ENCOUNTER — Ambulatory Visit
Admission: EM | Admit: 2019-05-07 | Discharge: 2019-05-07 | Disposition: A | Discharge status: Home / Self Care | Payer: Self-pay | Attending: Emergency Medicine | Admitting: Emergency Medicine

## 2019-05-07 ENCOUNTER — Encounter: Payer: Self-pay | Admitting: Emergency Medicine

## 2019-05-07 DIAGNOSIS — K0889 Other specified disorders of teeth and supporting structures: Secondary | ICD-10-CM

## 2019-05-07 DIAGNOSIS — K047 Periapical abscess without sinus: Secondary | ICD-10-CM

## 2019-05-07 MED ORDER — IBUPROFEN 800 MG PO TABS: 800.0000 mg | ORAL_TABLET | Freq: Three times a day (TID) | ORAL | 0 refills | Status: DC

## 2019-05-07 MED ORDER — HYDROCODONE-ACETAMINOPHEN 5-325 MG PO TABS: 1.0000 | ORAL_TABLET | Freq: Four times a day (QID) | ORAL | 0 refills | Status: DC | PRN

## 2019-05-07 MED ORDER — CLINDAMYCIN HCL 300 MG PO CAPS: 300.0000 mg | ORAL_CAPSULE | Freq: Four times a day (QID) | ORAL | 0 refills | Status: AC

## 2019-05-07 NOTE — ED Triage Notes (Signed)
Patient c/o abscessed tooth that started yesterday. Patient does have an appointment with the dentist next week.

## 2019-05-07 NOTE — ED Provider Notes (Signed)
[facility]    CSN: [id number] Arrival date & time: 05/07/19  1756      History   Chief Complaint Chief Complaint  Patient presents with  . Dental Pain    HPI Micheal Perry is a 43 y.o. male.   Pt presents with dental pain. Started aprox 6 months ago and has progressively gotten worse. Pt attemtped to get an appointment with dentist, but there were unable to get him in within this week. Pt has tried multiple OTC meds (unsure of the names; taking what his wife gives him) to no avail. No recent abx.  History reviewed. No pertinent past medical history.  There are no active problems to display for this patient.   Past Surgical History:  Procedure Laterality Date  . ABDOMINAL SURGERY         Home Medications    Prior to Admission medications   Medication Sig Start Date End Date Taking? Authorizing Provider  clindamycin (CLEOCIN) 300 MG capsule Take 1 capsule (300 mg total) by mouth 4 (four) times daily for 10 days. 05/07/19 05/17/19  Bailey MechBenjamin, Brandalynn Ofallon, NP  HYDROcodone-acetaminophen (NORCO/VICODIN) 5-325 MG tablet Take 1-2 tablets by mouth every 6 (six) hours as needed. 05/07/19   Bailey MechBenjamin, Kirti Carl, NP  ibuprofen (ADVIL) 800 MG tablet Take 1 tablet (800 mg total) by mouth 3 (three) times daily. 05/07/19   Bailey MechBenjamin, D'Arcy Abraha, NP  ondansetron (ZOFRAN-ODT) 4 MG disintegrating tablet Take 1 tablet (4 mg total) by mouth every 8 (eight) hours as needed for nausea or vomiting. 02/13/18   Tommie Samsook, Jayce G, DO    Family History Family History  Problem Relation Age of Onset  . Healthy Mother   . Healthy Father     Social History Social History   Tobacco Use  . Smoking status: Never Smoker  . Smokeless tobacco: Never Used  Substance Use Topics  . Alcohol use: No    Frequency: Never  . Drug use: Yes    Types: Marijuana    Comment: occasional use of MJ     Allergies   Patient has no known allergies.   Review of Systems Review of Systems  HENT: Positive for dental  problem and facial swelling. Negative for drooling and mouth sores.   All other systems reviewed and are negative.    Physical Exam Triage Vital Signs ED Triage Vitals  Enc Vitals Group     BP 05/07/19 1804 (!) 125/96     Pulse Rate 05/07/19 1804 81     Resp 05/07/19 1804 18     Temp 05/07/19 1804 98.3 F (36.8 C)     Temp Source 05/07/19 1804 Oral     SpO2 05/07/19 1804 99 %     Weight 05/07/19 1805 175 lb (79.4 kg)     Height 05/07/19 1805 6' 1.25" (1.861 m)     Head Circumference --      Peak Flow --      Pain Score 05/07/19 1805 10     Pain Loc --      Pain Edu? --      Excl. in GC? --    No data found.  Updated Vital Signs BP (!) 125/96 (BP Location: Right Arm)   Pulse 81   Temp 98.3 F (36.8 C) (Oral)   Resp 18   Ht 6' 1.25" (1.861 m)   Wt 175 lb (79.4 kg)   SpO2 99%   BMI 22.93 kg/m   Visual Acuity Right Eye Distance:  Left Eye Distance:   Bilateral Distance:    Right Eye Near:   Left Eye Near:    Bilateral Near:     Physical Exam HENT:     Head: Normocephalic.     Mouth/Throat:     Dentition: Dental tenderness, dental caries and dental abscesses present.     Tonsils: No tonsillar abscesses.       UC Treatments / Results  Labs (all labs ordered are listed, but only abnormal results are displayed) Labs Reviewed - No data to display    Initial Impression / Assessment and Plan / UC Course  I have reviewed the triage vital signs and the nursing notes.  Pertinent labs & imaging results that were available during my care of the patient were reviewed by me and considered in my medical decision making (see chart for details).    Pt reports pain to above site. No drainage noted. Pt treated with medications below. Pt instructed to follow-up with dentist ASAP. Final Clinical Impressions(s) / UC Diagnoses   Final diagnoses:  Pain, dental  Dental abscess   Discharge Instructions   None    ED Prescriptions    Medication Sig Dispense Auth.  Provider   ibuprofen (ADVIL) 800 MG tablet Take 1 tablet (800 mg total) by mouth 3 (three) times daily. 21 tablet Gertie Baron, NP   HYDROcodone-acetaminophen (NORCO/VICODIN) 5-325 MG tablet Take 1-2 tablets by mouth every 6 (six) hours as needed. 10 tablet Gertie Baron, NP   clindamycin (CLEOCIN) 300 MG capsule Take 1 capsule (300 mg total) by mouth 4 (four) times daily for 10 days. 40 capsule Gertie Baron, NP     Controlled Substance Prescriptions [place] Controlled Substance Registry consulted? Yes, I have consulted the [place] Controlled Substances Registry for this patient, and feel the risk/benefit ratio today is favorable for proceeding with this prescription for a controlled substance.   Gertie Baron, NP 05/07/19 1847

## 2021-10-11 ENCOUNTER — Ambulatory Visit
Admission: EM | Admit: 2021-10-11 | Discharge: 2021-10-11 | Disposition: A | Discharge status: Home / Self Care | Payer: Medicaid Other | Attending: Medical Oncology | Admitting: Medical Oncology

## 2021-10-11 ENCOUNTER — Other Ambulatory Visit: Payer: Self-pay

## 2021-10-11 DIAGNOSIS — R0981 Nasal congestion: Secondary | ICD-10-CM | POA: Insufficient documentation

## 2021-10-11 DIAGNOSIS — Z20822 Contact with and (suspected) exposure to covid-19: Secondary | ICD-10-CM | POA: Insufficient documentation

## 2021-10-11 DIAGNOSIS — R509 Fever, unspecified: Secondary | ICD-10-CM | POA: Insufficient documentation

## 2021-10-11 DIAGNOSIS — J069 Acute upper respiratory infection, unspecified: Secondary | ICD-10-CM

## 2021-10-11 DIAGNOSIS — J029 Acute pharyngitis, unspecified: Secondary | ICD-10-CM | POA: Insufficient documentation

## 2021-10-11 DIAGNOSIS — R059 Cough, unspecified: Secondary | ICD-10-CM | POA: Insufficient documentation

## 2021-10-11 LAB — RAPID INFLUENZA A&B ANTIGENS
Influenza A (ARMC): NEGATIVE
Influenza B (ARMC): NEGATIVE

## 2021-10-11 MED ORDER — FLUTICASONE PROPIONATE 50 MCG/ACT NA SUSP: 2.0000 | Freq: Every day | NASAL | 0 refills | Status: DC

## 2021-10-11 MED ORDER — BENZONATATE 100 MG PO CAPS: 100.0000 mg | ORAL_CAPSULE | Freq: Three times a day (TID) | ORAL | 0 refills | Status: DC

## 2021-10-11 NOTE — ED Triage Notes (Signed)
Patient presents to Urgent Care with complaints of fever, productive cough and sore throat since 3 days ago. Treating symptoms with tylenol and robitussin.

## 2021-10-11 NOTE — ED Provider Notes (Signed)
[facility]    CSN: [id number] Arrival date & time: 10/11/21  1615      History   Chief Complaint Chief Complaint  Patient presents with   Fever   Sore Throat    HPI Micheal Perry is a 45 y.o. male. Pt presents with his wife.   HPI  Cough: Pt reports that for the past 3 days he has had a wet/dry cough, fever and sore throat. He has taken tylenol and robitussin for symptoms. He denies SOB, wheezing, hemoptysis. No known sick contacts.   History reviewed. No pertinent past medical history.  There are no problems to display for this patient.   Past Surgical History:  Procedure Laterality Date   ABDOMINAL SURGERY         Home Medications    Prior to Admission medications   Medication Sig Start Date End Date Taking? Authorizing Provider  HYDROcodone-acetaminophen (NORCO/VICODIN) 5-325 MG tablet Take 1-2 tablets by mouth every 6 (six) hours as needed. 05/07/19   Bailey Mech, NP  ibuprofen (ADVIL) 800 MG tablet Take 1 tablet (800 mg total) by mouth 3 (three) times daily. 05/07/19   Bailey Mech, NP  ondansetron (ZOFRAN-ODT) 4 MG disintegrating tablet Take 1 tablet (4 mg total) by mouth every 8 (eight) hours as needed for nausea or vomiting. 02/13/18   Tommie Sams, DO    Family History Family History  Problem Relation Age of Onset   Healthy Mother    Healthy Father     Social History Social History   Tobacco Use   Smoking status: Never   Smokeless tobacco: Never  Vaping Use   Vaping Use: Never used  Substance Use Topics   Alcohol use: No   Drug use: Yes    Types: Marijuana    Comment: occasional use of MJ     Allergies   Patient has no known allergies.   Review of Systems Review of Systems  As stated above in HPI Physical Exam Triage Vital Signs ED Triage Vitals  Enc Vitals Group     BP 10/11/21 1639 108/84     Pulse Rate 10/11/21 1639 77     Resp 10/11/21 1639 16     Temp 10/11/21 1639 98.7 F (37.1 C)     Temp Source  10/11/21 1639 Oral     SpO2 10/11/21 1639 99 %     Weight --      Height --      Head Circumference --      Peak Flow --      Pain Score 10/11/21 1637 5     Pain Loc --      Pain Edu? --      Excl. in GC? --    No data found.  Updated Vital Signs BP 108/84 (BP Location: Left Arm)   Pulse 77   Temp 98.7 F (37.1 C) (Oral)   Resp 16   SpO2 99%   Physical Exam Vitals and nursing note reviewed.  Constitutional:      General: He is not in acute distress.    Appearance: He is well-developed. He is not ill-appearing, toxic-appearing or diaphoretic.  HENT:     Head: Normocephalic and atraumatic.     Right Ear: Tympanic membrane normal. No middle ear effusion. Tympanic membrane is not erythematous.     Left Ear: Tympanic membrane normal.  No middle ear effusion. Tympanic membrane is not erythematous.     Nose: Congestion and rhinorrhea present.  Mouth/Throat:     Mouth: Mucous membranes are moist.     Pharynx: Oropharynx is clear. Uvula midline. No oropharyngeal exudate or posterior oropharyngeal erythema.     Tonsils: No tonsillar exudate or tonsillar abscesses.  Eyes:     Conjunctiva/sclera: Conjunctivae normal.     Pupils: Pupils are equal, round, and reactive to light.  Cardiovascular:     Rate and Rhythm: Normal rate and regular rhythm.     Heart sounds: Normal heart sounds.  Pulmonary:     Effort: Pulmonary effort is normal.     Breath sounds: Normal breath sounds.  Musculoskeletal:     Cervical back: Normal range of motion and neck supple.  Lymphadenopathy:     Cervical: No cervical adenopathy.  Skin:    General: Skin is warm.  Neurological:     Mental Status: He is alert and oriented to person, place, and time.     UC Treatments / Results  Labs (all labs ordered are listed, but only abnormal results are displayed) Labs Reviewed  RAPID INFLUENZA A&B ANTIGENS    EKG   Radiology No results found.  Procedures Procedures (including critical care  time)  Medications Ordered in UC Medications - No data to display  Initial Impression / Assessment and Plan / UC Course  I have reviewed the triage vital signs and the nursing notes.  Pertinent labs & imaging results that were available during my care of the patient were reviewed by me and considered in my medical decision making (see chart for details).     New. Influenza testing is negative. COVID-19 testing pending. For now rest, hydration with water, tessalon and flonase. Ibuprofen or tylenol as needed for fever. Discussed red flag signs and symptoms. Follow up PRN.  Final Clinical Impressions(s) / UC Diagnoses   Final diagnoses:  None   Discharge Instructions   None    ED Prescriptions   None    PDMP not reviewed this encounter.   Rushie Chestnut, [place] 10/11/21 1711

## 2021-10-12 LAB — SARS CORONAVIRUS 2 (TAT 6-24 HRS): SARS Coronavirus 2: NEGATIVE

## 2021-10-13 ENCOUNTER — Ambulatory Visit (INDEPENDENT_AMBULATORY_CARE_PROVIDER_SITE_OTHER): Payer: Self-pay

## 2021-10-13 ENCOUNTER — Encounter: Payer: Self-pay | Admitting: Emergency Medicine

## 2021-10-13 ENCOUNTER — Ambulatory Visit
Admission: EM | Admit: 2021-10-13 | Discharge: 2021-10-13 | Disposition: A | Discharge status: Home / Self Care | Payer: Self-pay | Attending: Internal Medicine | Admitting: Internal Medicine

## 2021-10-13 ENCOUNTER — Other Ambulatory Visit: Payer: Self-pay

## 2021-10-13 DIAGNOSIS — R509 Fever, unspecified: Secondary | ICD-10-CM

## 2021-10-13 DIAGNOSIS — E871 Hypo-osmolality and hyponatremia: Secondary | ICD-10-CM

## 2021-10-13 DIAGNOSIS — R5383 Other fatigue: Secondary | ICD-10-CM

## 2021-10-13 LAB — COMPREHENSIVE METABOLIC PANEL
ALT: 17 U/L (ref 0–44)
AST: 28 U/L (ref 15–41)
Albumin: 4 g/dL (ref 3.5–5.0)
Alkaline Phosphatase: 45 U/L (ref 38–126)
Anion gap: 11 (ref 5–15)
BUN: 16 mg/dL (ref 6–20)
CO2: 24 mmol/L (ref 22–32)
Calcium: 9.6 mg/dL (ref 8.9–10.3)
Chloride: 95 mmol/L — ABNORMAL LOW (ref 98–111)
Creatinine, Ser: 1.14 mg/dL (ref 0.61–1.24)
GFR, Estimated: >60 mL/min (ref >60)
Glucose, Bld: 104 mg/dL — ABNORMAL HIGH (ref 70–99)
Potassium: 4.1 mmol/L (ref 3.5–5.1)
Sodium: 130 mmol/L — ABNORMAL LOW (ref 135–145)
Total Bilirubin: 0.7 mg/dL (ref 0.3–1.2)
Total Protein: 8.7 g/dL — ABNORMAL HIGH (ref 6.5–8.1)

## 2021-10-13 LAB — CBC WITH DIFFERENTIAL/PLATELET
Abs Immature Granulocytes: 0.01 10*3/uL (ref 0.00–0.07)
Basophils Absolute: 0 10*3/uL (ref 0.0–0.1)
Basophils Relative: 1 %
Eosinophils Absolute: 0.1 10*3/uL (ref 0.0–0.5)
Eosinophils Relative: 3 %
HCT: 44.2 % (ref 39.0–52.0)
Hemoglobin: 14.6 g/dL (ref 13.0–17.0)
Immature Granulocytes: 0 %
Lymphocytes Relative: 34 %
Lymphs Abs: 1.5 10*3/uL (ref 0.7–4.0)
MCH: 28.7 pg (ref 26.0–34.0)
MCHC: 33 g/dL (ref 30.0–36.0)
MCV: 86.8 fL (ref 80.0–100.0)
Monocytes Absolute: 0.5 10*3/uL (ref 0.1–1.0)
Monocytes Relative: 11 %
Neutro Abs: 2.3 10*3/uL (ref 1.7–7.7)
Neutrophils Relative %: 51 %
Platelets: 225 10*3/uL (ref 150–400)
RBC: 5.09 MIL/uL (ref 4.22–5.81)
RDW: 12.3 % (ref 11.5–15.5)
WBC: 4.3 10*3/uL (ref 4.0–10.5)
nRBC: 0 % (ref 0.0–0.2)

## 2021-10-13 LAB — MONONUCLEOSIS SCREEN: Mono Screen: NEGATIVE

## 2021-10-13 NOTE — Discharge Instructions (Addendum)
Your white cell count is normal and there are no signs of bacterial infection. Your chemistry shows your sodium and Chloride are are little low, and this could happen with a lot of vomiting or drinking too much water. Increase your salt intake and follow up with your primary care doctor next week.  Your chest xray does not show pneumonia or anything else abnormal.  The mono test is negative We will inform you if the HIV test is positive.
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
## 2021-10-13 NOTE — ED Triage Notes (Signed)
Patient states that he was seen here on 10/13/21 for cold symptoms and covid and flu test and both were negative.  Patient states he does not have a cough.  Patient states that he has been having  fever and chills for the past 3 days.

## 2021-10-13 NOTE — ED Notes (Signed)
Patient unable to give a urine specimen after drinking full cup of water and trying twice.

## 2021-10-14 LAB — HIV ANTIBODY (ROUTINE TESTING W REFLEX): HIV Screen 4th Generation wRfx: NONREACTIVE

## 2022-01-18 ENCOUNTER — Other Ambulatory Visit: Payer: Self-pay

## 2022-01-18 ENCOUNTER — Ambulatory Visit
Admission: EM | Admit: 2022-01-18 | Discharge: 2022-01-18 | Disposition: A | Discharge status: Home / Self Care | Payer: Medicaid Other | Attending: Emergency Medicine | Admitting: Emergency Medicine

## 2022-01-18 DIAGNOSIS — R0981 Nasal congestion: Secondary | ICD-10-CM | POA: Insufficient documentation

## 2022-01-18 DIAGNOSIS — B349 Viral infection, unspecified: Secondary | ICD-10-CM | POA: Insufficient documentation

## 2022-01-18 DIAGNOSIS — R051 Acute cough: Secondary | ICD-10-CM | POA: Insufficient documentation

## 2022-01-18 DIAGNOSIS — Z20822 Contact with and (suspected) exposure to covid-19: Secondary | ICD-10-CM | POA: Insufficient documentation

## 2022-01-18 LAB — RESP PANEL BY RT-PCR (FLU A&B, COVID) ARPGX2
Influenza A by PCR: NEGATIVE
Influenza B by PCR: NEGATIVE
SARS Coronavirus 2 by RT PCR: NEGATIVE

## 2022-01-18 MED ORDER — LEVOCETIRIZINE DIHYDROCHLORIDE 5 MG PO TABS: 5.0000 mg | ORAL_TABLET | Freq: Every evening | ORAL | 0 refills | Status: AC

## 2022-01-18 NOTE — Discharge Instructions (Addendum)
Your flu and COVID test was negative Take over-the-counter cough and cold medications as this could be something viral or could be seasonal allergies Take Tylenol or ibuprofen as needed If symptoms become worse you can return

## 2022-01-18 NOTE — ED Provider Notes (Signed)
[facility]    CSN: [id number] Arrival date & time: 01/18/22  1132      History   Chief Complaint No chief complaint on file.   HPI Micheal Perry is a 46 y.o. male.   Patient presents this morning for cough and congestion.  Patient states coworkers have been tested for COVID and been positive he would like to have test completed.  Denies any fever no nausea vomiting or diarrhea.  Has not taken anything prior to arrival.  Patient denies any shortness of breath or chest pain.   History reviewed. No pertinent past medical history.  There are no problems to display for this patient.   Past Surgical History:  Procedure Laterality Date   ABDOMINAL SURGERY         Home Medications    Prior to Admission medications   Medication Sig Start Date End Date Taking? Authorizing Provider  benzonatate (TESSALON) 100 MG capsule Take 1 capsule (100 mg total) by mouth every 8 (eight) hours. 10/11/21  Yes Covington, Sarah M, PA-C  fluticasone [facility]) 50 MCG/ACT nasal spray Place 2 sprays into both nostrils daily. 10/11/21  Yes Covington, Sarah M, PA-C  HYDROcodone-acetaminophen (NORCO/VICODIN) 5-325 MG tablet Take 1-2 tablets by mouth every 6 (six) hours as needed. 05/07/19  Yes Bailey Mech, NP  ibuprofen (ADVIL) 800 MG tablet Take 1 tablet (800 mg total) by mouth 3 (three) times daily. 05/07/19  Yes Bailey Mech, NP  levocetirizine (XYZAL ALLERGY 24HR) 5 MG tablet Take 1 tablet (5 mg total) by mouth every evening. 01/18/22  Yes Coralyn Mark, NP  ondansetron (ZOFRAN-ODT) 4 MG disintegrating tablet Take 1 tablet (4 mg total) by mouth every 8 (eight) hours as needed for nausea or vomiting. 02/13/18  Yes Tommie Sams, DO    Family History Family History  Problem Relation Age of Onset   Healthy Mother    Healthy Father     Social History Social History   Tobacco Use   Smoking status: Never   Smokeless tobacco: Never  Vaping Use   Vaping Use: Never used   Substance Use Topics   Alcohol use: Yes   Drug use: Yes    Types: Marijuana    Comment: occasional use of MJ     Allergies   Patient has no known allergies.   Review of Systems Review of Systems  Constitutional:  Negative for fever.  HENT:  Positive for congestion.   Respiratory:  Positive for cough. Negative for shortness of breath.   Cardiovascular: Negative.   Gastrointestinal: Negative.   Genitourinary: Negative.   Musculoskeletal: Negative.   Neurological: Negative.     Physical Exam Triage Vital Signs ED Triage Vitals  Enc Vitals Group     BP 01/18/22 1147 125/87     Pulse Rate 01/18/22 1147 65     Resp 01/18/22 1147 18     Temp 01/18/22 1147 98.1 F (36.7 C)     Temp Source 01/18/22 1147 Oral     SpO2 01/18/22 1147 100 %     Weight 01/18/22 1145 190 lb (86.2 kg)     Height 01/18/22 1145 6\' 1"  (1.854 m)     Head Circumference --      Peak Flow --      Pain Score 01/18/22 1145 0     Pain Loc --      Pain Edu? --      Excl. in [place]? --    No data found.  Updated Vital Signs BP 125/87 (BP Location: Left Arm)    Pulse 65    Temp 98.1 F (36.7 C) (Oral)    Resp 18    Ht 6\' 1"  (1.854 m)    Wt 190 lb (86.2 kg)    SpO2 100%    BMI 25.07 kg/m   Visual Acuity Right Eye Distance:   Left Eye Distance:   Bilateral Distance:    Right Eye Near:   Left Eye Near:    Bilateral Near:     Physical Exam Constitutional:      Appearance: Normal appearance. He is normal weight.  HENT:     Nose: Nose normal.  Eyes:     Pupils: Pupils are equal, round, and reactive to light.  Cardiovascular:     Rate and Rhythm: Normal rate.  Pulmonary:     Effort: Pulmonary effort is normal.  Abdominal:     General: Abdomen is flat.  Musculoskeletal:        General: Normal range of motion.     Cervical back: Normal range of motion.  Skin:    General: Skin is warm.  Neurological:     General: No focal deficit present.     Mental Status: He is alert.     UC Treatments /  Results  Labs (all labs ordered are listed, but only abnormal results are displayed) Labs Reviewed  RESP PANEL BY RT-PCR (FLU A&B, COVID) [id number]    EKG   Radiology No results found.  Procedures Procedures (including critical care time)  Medications Ordered in UC Medications - No data to display  Initial Impression / Assessment and Plan / UC Course  I have reviewed the triage vital signs and the nursing notes.  Pertinent labs & imaging results that were available during my care of the patient were reviewed by me and considered in my medical decision making (see chart for details).     Your flu and COVID test was negative Take over-the-counter cough and cold medications as this could be something viral or could be seasonal allergies Take Tylenol or ibuprofen as needed If symptoms become worse you can return Use a humidifier as needed to help with the cough Final Clinical Impressions(s) / UC Diagnoses   Final diagnoses:  Viral illness  Acute cough     Discharge Instructions      Your flu and COVID test was negative Take over-the-counter cough and cold medications as this could be something viral or could be seasonal allergies Take Tylenol or ibuprofen as needed If symptoms become worse you can return    ED Prescriptions     Medication Sig Dispense Auth. Provider   levocetirizine (XYZAL ALLERGY 24HR) 5 MG tablet Take 1 tablet (5 mg total) by mouth every evening. 30 tablet , NP      PDMP not reviewed this encounter.   Coralyn Mark, NP 01/18/22 1234

## 2022-01-18 NOTE — ED Triage Notes (Signed)
Pt c/o sore throat, loss of voice, cough x2days.    Pt was around coworkers who had covid x3days ago.   Pt wants to be tested for covid.
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
## 2022-12-13 ENCOUNTER — Ambulatory Visit: Admission: EM | Admit: 2022-12-13 | Discharge: 2022-12-13 | Payer: Self-pay

## 2022-12-13 ENCOUNTER — Encounter: Payer: Self-pay | Admitting: Emergency Medicine

## 2022-12-13 DIAGNOSIS — M546 Pain in thoracic spine: Secondary | ICD-10-CM

## 2022-12-13 DIAGNOSIS — R200 Anesthesia of skin: Secondary | ICD-10-CM

## 2022-12-13 DIAGNOSIS — R202 Paresthesia of skin: Secondary | ICD-10-CM

## 2022-12-13 NOTE — ED Triage Notes (Signed)
Pt slipped and fell 1 week ago. He c/o lower back pain, tingling in bilateral feet and legs. He has numbness around his groin area and his scrotum feels like its tightening when he walks.

## 2022-12-13 NOTE — Discharge Instructions (Addendum)
As we discussed, your cluster of symptoms are concerning for possible spinal cord impingement given your recent fall with the numbness in your legs, weakness in your legs, and numbness in your groin.  Please go to the ER at [facility] for further evaluation.  Please go now.

## 2022-12-13 NOTE — ED Provider Notes (Signed)
[facility]    CSN: [id number] Arrival date & time: 12/13/22  1253      History   Chief Complaint Chief Complaint  Patient presents with   Back Pain    HPI Micheal Perry is a 47 y.o. male.   HPI  47 year old male here for evaluation of back pain.  The patient reports that 1 week ago he was walking up a hill when he slipped and fell onto his left side.  The following day he developed pain in his low back along with tingling in both legs down into his feet, numbness in his groin, difficulty starting his urine stream, and a feeling of weakness in both of his lower legs.  History reviewed. No pertinent past medical history.  There are no problems to display for this patient.   Past Surgical History:  Procedure Laterality Date   ABDOMINAL SURGERY         Home Medications    Prior to Admission medications   Medication Sig Start Date End Date Taking? Authorizing Provider  benzonatate (TESSALON) 100 MG capsule Take 1 capsule (100 mg total) by mouth every 8 (eight) hours. 10/11/21   Hughie Closs, PA-C  fluticasone (FLONASE) 50 MCG/ACT nasal spray Place 2 sprays into both nostrils daily. 10/11/21   Hughie Closs, PA-C  HYDROcodone-acetaminophen (NORCO/VICODIN) 5-325 MG tablet Take 1-2 tablets by mouth every 6 (six) hours as needed. 05/07/19   Gertie Baron, NP  ibuprofen (ADVIL) 800 MG tablet Take 1 tablet (800 mg total) by mouth 3 (three) times daily. 05/07/19   Gertie Baron, NP  levocetirizine [facility] ALLERGY 24HR) 5 MG tablet Take 1 tablet (5 mg total) by mouth every evening. 01/18/22   Marney Setting, NP  ondansetron (ZOFRAN-ODT) 4 MG disintegrating tablet Take 1 tablet (4 mg total) by mouth every 8 (eight) hours as needed for nausea or vomiting. 02/13/18   Coral Spikes, DO    Family History Family History  Problem Relation Age of Onset   Healthy Mother    Healthy Father     Social History Social History   Tobacco Use   Smoking  status: Never   Smokeless tobacco: Never  Vaping Use   Vaping Use: Never used  Substance Use Topics   Alcohol use: Yes   Drug use: Yes    Types: Marijuana    Comment: occasional use of MJ     Allergies   Patient has no known allergies.   Review of Systems Review of Systems  Constitutional:  Negative for fever.  Musculoskeletal:  Positive for back pain.  Neurological:  Positive for weakness and numbness.  Hematological: Negative.   Psychiatric/Behavioral: Negative.       Physical Exam Triage Vital Signs ED Triage Vitals  Enc Vitals Group     BP      Pulse      Resp      Temp      Temp src      SpO2      Weight      Height      Head Circumference      Peak Flow      Pain Score      Pain Loc      Pain Edu?      Excl. in [place]?    No data found.  Updated Vital Signs BP (!) 132/109 (BP Location: Left Arm)   Pulse 81   Temp 98.2 F (36.8 C) (Oral)  Resp 16   SpO2 100%   Visual Acuity Right Eye Distance:   Left Eye Distance:   Bilateral Distance:    Right Eye Near:   Left Eye Near:    Bilateral Near:     Physical Exam Vitals and nursing note reviewed.  Constitutional:      Appearance: Normal appearance. He is not ill-appearing.  HENT:     Head: Normocephalic and atraumatic.  Cardiovascular:     Rate and Rhythm: Normal rate and regular rhythm.     Pulses: Normal pulses.     Heart sounds: Normal heart sounds. No murmur heard.    No friction rub. No gallop.  Pulmonary:     Effort: Pulmonary effort is normal.     Breath sounds: Normal breath sounds. No wheezing, rhonchi or rales.  Musculoskeletal:        General: Tenderness and signs of injury present. No swelling or deformity.  Skin:    General: Skin is warm.     Capillary Refill: Capillary refill takes less than 2 seconds.  Neurological:     Mental Status: He is alert and oriented to person, place, and time.     Gait: Gait normal.     Deep Tendon Reflexes: Reflexes abnormal.      UC  Treatments / Results  Labs (all labs ordered are listed, but only abnormal results are displayed) Labs Reviewed - No data to display  EKG   Radiology No results found.  Procedures Procedures (including critical care time)  Medications Ordered in UC Medications - No data to display  Initial Impression / Assessment and Plan / UC Course  I have reviewed the triage vital signs and the nursing notes.  Pertinent labs & imaging results that were available during my care of the patient were reviewed by me and considered in my medical decision making (see chart for details).   Patient is a pleasant, nontoxic-appearing 47 year old male with no significant past medical history who presents for evaluation of back pain x 1 week status post ground-level fall.  This has associated saddle anesthesia along with paresthesias in both legs and feet.  The patient also endorses that he has difficulty starting his urine stream but he denies any incontinence of bowel or bladder.  On exam patient does have tenderness with palpation of the spinous process of T8-9 and T10.  No lumbar spinous process tenderness appreciated on exam.  Patient's bilateral lower extremities have 5/5 strength but patient does have decreased DTRs in the ankle and patella on the left.  Given patient's spinous process tenderness, back pain, paresthesias in the legs, saddle anesthesia, and difficulty with urination I am concerned that patient may have some spinal cord impingement.  This happened 1 week ago and he has a normal tandem gait and does not demonstrate any foot drop so I feel he is safe to travel via car but I feel he should be evaluated in the emergency department.  He has elected to go to [facility] in [place].  He left ambulatory in stable condition.   Final Clinical Impressions(s) / UC Diagnoses   Final diagnoses:  Acute midline thoracic back pain  Saddle anesthesia  Paresthesias     Discharge Instructions      As  we discussed, your cluster of symptoms are concerning for possible spinal cord impingement given your recent fall with the numbness in your legs, weakness in your legs, and numbness in your groin.  Please go [facility] the ER at [facility] for further evaluation.  Please go now.     ED Prescriptions   None    PDMP not reviewed this encounter.   Becky Augusta, NP 12/13/22 1325

## 2022-12-13 NOTE — ED Notes (Signed)
Patient is being discharged from the Urgent Care and sent to the Emergency Department via POV . Per Margarette Canada, NP, patient is in need of higher level of care due to r/o spinal cord impingement . Patient is aware and verbalizes understanding of plan of care.  Vitals:   12/13/22 1310  BP: (!) 132/109  Pulse: 81  Resp: 16  Temp: 98.2 F (36.8 C)  SpO2: 100%

## 2022-12-19 DIAGNOSIS — M5416 Radiculopathy, lumbar region: Secondary | ICD-10-CM | POA: Diagnosis not present

## 2023-01-15 DIAGNOSIS — M545 Low back pain, unspecified: Secondary | ICD-10-CM | POA: Diagnosis not present

## 2023-11-21 ENCOUNTER — Ambulatory Visit
Admission: EM | Admit: 2023-11-21 | Discharge: 2023-11-21 | Disposition: A | Discharge status: Home / Self Care | Payer: 59 | Attending: Physician Assistant | Admitting: Physician Assistant

## 2023-11-21 DIAGNOSIS — R202 Paresthesia of skin: Secondary | ICD-10-CM | POA: Diagnosis not present

## 2023-11-21 DIAGNOSIS — M5440 Lumbago with sciatica, unspecified side: Secondary | ICD-10-CM

## 2023-11-21 MED ORDER — PREDNISONE 10 MG PO TABS: ORAL_TABLET | ORAL | 0 refills | Status: AC

## 2023-11-21 NOTE — Discharge Instructions (Addendum)
LEG TINGLING/BACK PAIN: Likely pinched nerve. Begin prednisone taper. Stressed avoiding painful activities . RICE (REST, ICE, COMPRESSION, ELEVATION) guidelines reviewed. May alternate ice and heat. Consider use of muscle rubs, Salonpas patches, etc. Use medications as directed including muscle relaxers if prescribed. Take anti-inflammatory medications as prescribed or OTC NSAIDs/Tylenol.  F/u with PCP or ortho if symptoms persist after a couple of weeks.  BACK PAIN RED FLAGS: If the back pain acutely worsens or there are any red flag symptoms such as numbness/tingling, leg weakness, saddle anesthesia, or loss of bowel/bladder control, go immediately to the ER. Follow up with [place] as scheduled or sooner if the pain does not begin to resolve or if it worsens before the follow up
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
